# Patient Record
Sex: Female | Born: 1970 | Race: White | Hispanic: No | Marital: Married | Smoking: Never smoker
Health system: Southern US, Community
[De-identification: ages and names within clinical notes are randomized; demographics above are authoritative.]

## PROBLEM LIST (undated history)

## (undated) DIAGNOSIS — E669 Obesity, unspecified: Secondary | ICD-10-CM

## (undated) DIAGNOSIS — L570 Actinic keratosis: Secondary | ICD-10-CM

## (undated) DIAGNOSIS — Z1211 Encounter for screening for malignant neoplasm of colon: Secondary | ICD-10-CM

## (undated) DIAGNOSIS — L739 Follicular disorder, unspecified: Secondary | ICD-10-CM

## (undated) HISTORY — DX: Obesity, unspecified: E66.9

## (undated) HISTORY — DX: Encounter for screening for malignant neoplasm of colon: Z12.11

## (undated) HISTORY — DX: Actinic keratosis: L57.0

## (undated) HISTORY — DX: Follicular disorder, unspecified: L73.9

## (undated) HISTORY — PX: NO PAST SURGERIES: SHX2092

---

## 2004-10-04 ENCOUNTER — Ambulatory Visit: Payer: Self-pay

## 2005-09-29 ENCOUNTER — Inpatient Hospital Stay: Payer: Self-pay | Admitting: Unknown Physician Specialty

## 2009-02-03 ENCOUNTER — Ambulatory Visit: Payer: Self-pay

## 2009-02-22 DIAGNOSIS — Z86018 Personal history of other benign neoplasm: Secondary | ICD-10-CM

## 2009-02-22 HISTORY — DX: Personal history of other benign neoplasm: Z86.018

## 2011-04-17 ENCOUNTER — Ambulatory Visit: Payer: Self-pay

## 2017-12-10 DIAGNOSIS — L739 Follicular disorder, unspecified: Secondary | ICD-10-CM

## 2017-12-10 HISTORY — DX: Follicular disorder, unspecified: L73.9

## 2018-03-12 ENCOUNTER — Encounter: Payer: Self-pay | Admitting: Certified Nurse Midwife

## 2018-03-12 ENCOUNTER — Ambulatory Visit (INDEPENDENT_AMBULATORY_CARE_PROVIDER_SITE_OTHER): Payer: 59 | Admitting: Certified Nurse Midwife

## 2018-03-12 VITALS — BP 160/90 | HR 113 | Ht 67.0 in | Wt 224.0 lb

## 2018-03-12 DIAGNOSIS — Z1239 Encounter for other screening for malignant neoplasm of breast: Secondary | ICD-10-CM

## 2018-03-12 DIAGNOSIS — Z124 Encounter for screening for malignant neoplasm of cervix: Secondary | ICD-10-CM

## 2018-03-12 DIAGNOSIS — Z13220 Encounter for screening for lipoid disorders: Secondary | ICD-10-CM

## 2018-03-12 DIAGNOSIS — Z131 Encounter for screening for diabetes mellitus: Secondary | ICD-10-CM

## 2018-03-12 DIAGNOSIS — Z01419 Encounter for gynecological examination (general) (routine) without abnormal findings: Secondary | ICD-10-CM | POA: Diagnosis not present

## 2018-03-12 DIAGNOSIS — B373 Candidiasis of vulva and vagina: Secondary | ICD-10-CM | POA: Diagnosis not present

## 2018-03-12 DIAGNOSIS — B3731 Acute candidiasis of vulva and vagina: Secondary | ICD-10-CM

## 2018-03-12 DIAGNOSIS — E669 Obesity, unspecified: Secondary | ICD-10-CM | POA: Diagnosis not present

## 2018-03-12 DIAGNOSIS — N898 Other specified noninflammatory disorders of vagina: Secondary | ICD-10-CM

## 2018-03-12 DIAGNOSIS — Z1231 Encounter for screening mammogram for malignant neoplasm of breast: Secondary | ICD-10-CM | POA: Diagnosis not present

## 2018-03-12 DIAGNOSIS — Z1329 Encounter for screening for other suspected endocrine disorder: Secondary | ICD-10-CM

## 2018-03-12 DIAGNOSIS — R030 Elevated blood-pressure reading, without diagnosis of hypertension: Secondary | ICD-10-CM

## 2018-03-12 MED ORDER — FLUCONAZOLE 150 MG PO TABS: 150.0000 mg | ORAL_TABLET | Freq: Once | ORAL | 0 refills | Status: AC

## 2018-03-12 NOTE — Progress Notes (Signed)
Gynecology Annual Exam  PCP: Patient, No Pcp Per  Chief Complaint:  Chief Complaint  Patient presents with  . Gynecologic Exam    History of Present Illness: Deanna Rosales is a 47 year old MWF, G4 P3013, who presents  for her routine gyn exam. The patient has no significant gyn complaints, but has noticed a little vulvar itching recently.  Her menses are regular, they occur every month, and they last 28 days. Her flow is moderate with 2 heavier flow days requiring a pad change every 3 hours.. She does not have intermenstrual bleeding. Her last menstrual period was 02/24/2018. She denies lower abdominal cramping with her menses, but does have lower back pain. Last pap smear: 01/22/2014, results were NIL/neg   The patient is sexually active. She currently uses condoms for contraception. She does not have dyspareunia.  Since her last visit, she has gained 23#.   Her past medical history is remarkable for prehypertension or white coat syndrome  The patient does perform self breast exams. Her last mammogram was 01/22/2014, results were negative..   There is a family history of breast cancer in her mother. Genetic testing has not been done.   There is no family history of ovarian cancer.  The patient denies smoking.  She reports drinking alcohol. She reports have 3-6 drinks per week.   She denies illegal drug use.  The patient exercises by walking 30-60 minutes 4x/week..  The patient denies current symptoms of depression.    Review of Systems: Review of Systems  Constitutional: Negative for chills, fever and weight loss.  HENT: Negative for congestion, sinus pain and sore throat.   Eyes: Negative for blurred vision and pain.  Respiratory: Negative for hemoptysis, shortness of breath and wheezing.   Cardiovascular: Negative for chest pain, palpitations and leg swelling.  Gastrointestinal: Negative for abdominal pain, blood in stool, diarrhea, heartburn, nausea and  vomiting.  Genitourinary: Negative for dysuria, frequency, hematuria and urgency.       Positive for vaginal discharge, occasional vulvar itching  Musculoskeletal: Negative for back pain, joint pain and myalgias.  Skin: Negative for itching and rash.  Neurological: Negative for dizziness, tingling and headaches.  Endo/Heme/Allergies: Negative for environmental allergies and polydipsia. Does not bruise/bleed easily.       Negative for hirsutism   Psychiatric/Behavioral: Negative for depression. The patient is not nervous/anxious and does not have insomnia.     Past Medical History:  Past Medical History:  Diagnosis Date  . Folliculitis 6144   underarms  . Obesity (BMI 35.0-39.9 without comorbidity)    BMI=35    Past Surgical History:  Past Surgical History:  Procedure Laterality Date  . NO PAST SURGERIES      Family History:  Family History  Problem Relation Age of Onset  . Breast cancer Mother 62  . Hypertension Mother   . Hypertension Father   . Melanoma Maternal Aunt   . Hypertension Brother   . Hypertension Brother     Social History:  Social History   Socioeconomic History  . Marital status: Married    Spouse name: Not on file  . Number of children: 3  . Years of education: Not on file  . Highest education level: Not on file  Occupational History  . Occupation: Agricultural engineer  Social Needs  . Financial resource strain: Not on file  . Food insecurity:    Worry: Not on file    Inability: Not on file  . Transportation  needs:    Medical: Not on file    Non-medical: Not on file  Tobacco Use  . Smoking status: Never Smoker  . Smokeless tobacco: Never Used  Substance and Sexual Activity  . Alcohol use: Yes    Alcohol/week: 1.8 - 3.6 oz    Types: 3 - 6 Glasses of wine per week  . Drug use: Not Currently  . Sexual activity: Yes    Partners: Male    Birth control/protection: Condom  Lifestyle  . Physical activity:    Days per week: 5 days    Minutes per  session: 50 min  . Stress: Not at all  Relationships  . Social connections:    Talks on phone: Not on file    Gets together: Not on file    Attends religious service: Not on file    Active member of club or organization: Not on file    Attends meetings of clubs or organizations: Not on file    Relationship status: Not on file  . Intimate partner violence:    Fear of current or ex partner: Not on file    Emotionally abused: Not on file    Physically abused: Not on file    Forced sexual activity: Not on file  Other Topics Concern  . Not on file  Social History Narrative  . Not on file    Allergies:  No Known Allergies  Medications: Current Outpatient Medications on File Prior to Visit  Medication Sig Dispense Refill  . Calcium Carbonate-Vit D-Min (CALTRATE 600+D PLUS MINERALS) 600-800 MG-UNIT TABS Take 1 tablet by mouth daily.    . Cholecalciferol (VITAMIN D3) 1000 units CAPS Take 1 capsule by mouth daily.    Marland Kitchen co-enzyme Q-10 30 MG capsule Take 30 mg by mouth daily.    Jerrye Beavers Polysacch (ALCORTIN A) 1-2-1 % GEL APPLY A THIN LAYER TO AFFECTED AREA(S) AT BEDTIME  2  . Multiple Vitamin (MULTIVITAMIN) tablet Take 1 tablet by mouth daily.    . Probiotic Product (PROBIOTIC-10 PO) Take 1 capsule by mouth daily.     No current facility-administered medications on file prior to visit.   Physical Exam Vitals: BP (!) 160/90   Pulse (!) 113   Ht 5\' 7"  (1.702 m)   Wt 224 lb (101.6 kg)   LMP 02/24/2018 (Exact Date)   BMI 35.08 kg/m   General: WF in NAD HEENT: normocephalic, anicteric Neck: no thyroid enlargement, no palpable nodules, no cervical lymphadenopathy  Pulmonary: No increased work of breathing, CTAB Cardiovascular: RRR, without murmur  Breast: Breast symmetrical, no tenderness, no palpable nodules or masses, no skin or nipple retraction present, no nipple discharge.  No axillary, infraclavicular or supraclavicular lymphadenopathy. Abdomen: Soft, non-tender,  non-distended.  Umbilicus without lesions.  No hepatomegaly or masses palpable. No evidence of hernia. Genitourinary:  External: Normal external female genitalia.  Normal urethral meatus, normal  Bartholin's and Skene's glands.    Vagina: Normal vaginal mucosa, poor tone, cottage cheese discharge   Cervix: Grossly normal in appearance, anterior, no bleeding, non-tender  Uterus: Retroverted, normal size, shape, and consistency, mobile, and non-tender  Adnexa: No adnexal masses, non-tender  Rectal: deferred  Lymphatic: no evidence of inguinal lymphadenopathy Extremities: no edema, erythema, or tenderness Neurologic: Grossly intact Psychiatric: mood appropriate, affect full  Results for orders placed or performed in visit on 03/12/18 (from the past 72 hour(s))  POCT Wet Prep [facility])     Status: Abnormal   Collection Time: 03/17/18  8:41 PM  Result Value Ref Range   Source Wet Prep POC vagina    WBC, Wet Prep HPF POC     Bacteria Wet Prep HPF POC  Few   BACTERIA WET PREP MORPHOLOGY POC     Clue Cells Wet Prep HPF POC None None   Clue Cells Wet Prep Whiff POC     Yeast Wet Prep HPF POC Many    KOH Wet Prep POC     Trichomonas Wet Prep HPF POC Absent Absent     Assessment: 47 y.o. routine gyn exam Elevated blood pressure Monilial vulvovaginitis  Plan:    1) Breast cancer screening - recommend monthly self breast exam and annual screening mammograms Mammogram was ordered today.  2) States her blood pressure is always elevated when she goes for a medical appointment, but normal at home. Advised to take blood pressures daily at home or at work and keep a log. To check accuracy of her home blood pressure cuff by comparing readings at her work. To let me know if BP >1/=140/90  3) Cervical cancer screening - Pap was done. ASCCP guidelines and rational discussed.  Patient opts for every 3 years screening interva  4) Routine healthcare maintenance including cholesterol and diabetes  screening ordered today   5) Diflucan 150 mgm x 1 po.   6) RTO 1 year and prn.  Dalia Heading, CNM

## 2018-03-13 ENCOUNTER — Encounter (INDEPENDENT_AMBULATORY_CARE_PROVIDER_SITE_OTHER): Payer: Self-pay

## 2018-03-13 LAB — LIPID PANEL
CHOLESTEROL TOTAL: 197 mg/dL (ref 100–199)
Chol/HDL Ratio: 2.3 ratio (ref 0.0–4.4)
HDL: 87 mg/dL (ref >39)
LDL CALC: 93 mg/dL (ref 0–99)
Triglycerides: 83 mg/dL (ref 0–149)
VLDL CHOLESTEROL CAL: 17 mg/dL (ref 5–40)

## 2018-03-13 LAB — COMPREHENSIVE METABOLIC PANEL
A/G RATIO: 1.8 (ref 1.2–2.2)
ALK PHOS: 61 IU/L (ref 39–117)
ALT: 23 IU/L (ref 0–32)
AST: 20 IU/L (ref 0–40)
Albumin: 4.4 g/dL (ref 3.5–5.5)
BILIRUBIN TOTAL: 0.6 mg/dL (ref 0.0–1.2)
BUN/Creatinine Ratio: 13 (ref 9–23)
BUN: 10 mg/dL (ref 6–24)
CALCIUM: 9.8 mg/dL (ref 8.7–10.2)
CHLORIDE: 102 mmol/L (ref 96–106)
CO2: 23 mmol/L (ref 20–29)
Creatinine, Ser: 0.77 mg/dL (ref 0.57–1.00)
GFR calc Af Amer: 106 mL/min/{1.73_m2} (ref >59)
GFR, EST NON AFRICAN AMERICAN: 92 mL/min/{1.73_m2} (ref >59)
GLOBULIN, TOTAL: 2.4 g/dL (ref 1.5–4.5)
Glucose: 83 mg/dL (ref 65–99)
POTASSIUM: 3.7 mmol/L (ref 3.5–5.2)
SODIUM: 142 mmol/L (ref 134–144)
Total Protein: 6.8 g/dL (ref 6.0–8.5)

## 2018-03-13 LAB — CBC WITH DIFFERENTIAL/PLATELET
BASOS: 0 %
Basophils Absolute: 0 10*3/uL (ref 0.0–0.2)
EOS (ABSOLUTE): 0.1 10*3/uL (ref 0.0–0.4)
Eos: 2 %
HEMATOCRIT: 43 % (ref 34.0–46.6)
Hemoglobin: 14.6 g/dL (ref 11.1–15.9)
IMMATURE GRANULOCYTES: 0 %
Immature Grans (Abs): 0 10*3/uL (ref 0.0–0.1)
Lymphocytes Absolute: 1.8 10*3/uL (ref 0.7–3.1)
Lymphs: 23 %
MCH: 30 pg (ref 26.6–33.0)
MCHC: 34 g/dL (ref 31.5–35.7)
MCV: 88 fL (ref 79–97)
MONOS ABS: 0.4 10*3/uL (ref 0.1–0.9)
Monocytes: 5 %
NEUTROS PCT: 70 %
Neutrophils Absolute: 5.6 10*3/uL (ref 1.4–7.0)
Platelets: 273 10*3/uL (ref 150–379)
RBC: 4.87 x10E6/uL (ref 3.77–5.28)
RDW: 14 % (ref 12.3–15.4)
WBC: 7.9 10*3/uL (ref 3.4–10.8)

## 2018-03-13 LAB — HEMOGLOBIN A1C
ESTIMATED AVERAGE GLUCOSE: 94 mg/dL
HEMOGLOBIN A1C: 4.9 % (ref 4.8–5.6)

## 2018-03-13 LAB — TSH: TSH: 0.758 u[IU]/mL (ref 0.450–4.500)

## 2018-03-15 LAB — IGP, APTIMA HPV
HPV APTIMA: NEGATIVE
PAP SMEAR COMMENT: 0

## 2018-03-17 ENCOUNTER — Encounter: Payer: Self-pay | Admitting: Certified Nurse Midwife

## 2018-03-17 LAB — POCT WET PREP (WET MOUNT): TRICHOMONAS WET PREP HPF POC: ABSENT

## 2018-04-03 ENCOUNTER — Ambulatory Visit
Admission: RE | Admit: 2018-04-03 | Discharge: 2018-04-03 | Disposition: A | Discharge status: Home / Self Care | Payer: 59 | Source: Ambulatory Visit | Attending: Certified Nurse Midwife | Admitting: Certified Nurse Midwife

## 2018-04-03 DIAGNOSIS — Z1231 Encounter for screening mammogram for malignant neoplasm of breast: Secondary | ICD-10-CM | POA: Diagnosis not present

## 2018-04-03 DIAGNOSIS — Z1239 Encounter for other screening for malignant neoplasm of breast: Secondary | ICD-10-CM

## 2018-04-05 ENCOUNTER — Encounter (INDEPENDENT_AMBULATORY_CARE_PROVIDER_SITE_OTHER): Payer: Self-pay

## 2019-09-23 DIAGNOSIS — C4491 Basal cell carcinoma of skin, unspecified: Secondary | ICD-10-CM

## 2019-09-23 HISTORY — DX: Basal cell carcinoma of skin, unspecified: C44.91

## 2020-03-16 ENCOUNTER — Ambulatory Visit: Payer: 59 | Admitting: Dermatology

## 2020-05-02 ENCOUNTER — Other Ambulatory Visit: Payer: Self-pay

## 2020-05-02 ENCOUNTER — Ambulatory Visit: Payer: 59 | Admitting: Dermatology

## 2020-05-02 DIAGNOSIS — L720 Epidermal cyst: Secondary | ICD-10-CM

## 2020-05-02 DIAGNOSIS — L821 Other seborrheic keratosis: Secondary | ICD-10-CM

## 2020-05-02 DIAGNOSIS — L570 Actinic keratosis: Secondary | ICD-10-CM

## 2020-05-02 DIAGNOSIS — Z86018 Personal history of other benign neoplasm: Secondary | ICD-10-CM

## 2020-05-02 DIAGNOSIS — Z85828 Personal history of other malignant neoplasm of skin: Secondary | ICD-10-CM | POA: Diagnosis not present

## 2020-05-02 DIAGNOSIS — L814 Other melanin hyperpigmentation: Secondary | ICD-10-CM

## 2020-05-02 DIAGNOSIS — L578 Other skin changes due to chronic exposure to nonionizing radiation: Secondary | ICD-10-CM | POA: Diagnosis not present

## 2020-05-02 DIAGNOSIS — D229 Melanocytic nevi, unspecified: Secondary | ICD-10-CM

## 2020-05-02 NOTE — Patient Instructions (Addendum)
Recommend daily broad spectrum sunscreen SPF 30+ to sun-exposed areas, reapply every 2 hours as needed. Call for new or changing lesions.  Cryotherapy Aftercare  . Wash gently with soap and water everyday.   . Apply Vaseline and Band-Aid daily until healed.  

## 2020-05-02 NOTE — Progress Notes (Signed)
   Follow-Up Visit   Subjective  Deanna Rosales is a 49 y.o. female who presents for the following: Follow-up.  Patient here today to have sun exposed areas checked. She does have a history of dysplastic nevi and BCC. She does have one rough spot on right thigh that has been present for about 2 months. There is also a spot on left upper back/shoulder that patient noticed recently but non-symptomatic.   The following portions of the chart were reviewed this encounter and updated as appropriate:  Tobacco  Allergies  Meds  Problems  Med Hx  Surg Hx  Fam Hx      Review of Systems:  No other skin or systemic complaints except as noted in HPI or Assessment and Plan.  Objective  Well appearing patient in no apparent distress; mood and affect are within normal limits.  A focused examination was performed including extremities, back, face. Relevant physical exam findings are noted in the Assessment and Plan.  Objective  Mid Forehead: Erythematous thin papules/macules with gritty scale.   Objective  Left mid dorsum nose, left infranasal at nasal rim: Smooth white papule(s).    Assessment & Plan  AK (actinic keratosis) Mid Forehead  Destruction of lesion - Mid Forehead Complexity: simple   Destruction method: cryotherapy   Informed consent: discussed and consent obtained   Timeout:  patient name, date of birth, surgical site, and procedure verified Lesion destroyed using liquid nitrogen: Yes   Region frozen until ice ball extended beyond lesion: Yes   Outcome: patient tolerated procedure well with no complications   Post-procedure details: wound care instructions given    Milia Left mid dorsum nose, left infranasal at nasal rim  Benign-appearing.  Observation.  Call clinic for new or changing moles.  Recommend daily use of broad spectrum spf 30+ sunscreen to sun-exposed areas.     History of Basal Cell Carcinoma of the Skin - No evidence of recurrence today - Recommend  regular full body skin exams - Recommend daily broad spectrum sunscreen SPF 30+ to sun-exposed areas, reapply every 2 hours as needed.  - Call if any new or changing lesions are noted between office visits  History of Dysplastic Nevi - No evidence of recurrence today - Recommend regular full body skin exams - Recommend daily broad spectrum sunscreen SPF 30+ to sun-exposed areas, reapply every 2 hours as needed.  - Call if any new or changing lesions are noted between office visits  Actinic Damage - diffuse scaly erythematous macules with underlying dyspigmentation - Recommend daily broad spectrum sunscreen SPF 30+ to sun-exposed areas, reapply every 2 hours as needed.  - Call for new or changing lesions.  Seborrheic Keratoses - Stuck-on, waxy, tan-brown papules and plaques  - Discussed benign etiology and prognosis. - Observe - Call for any changes  Melanocytic Nevi - Tan-brown and/or pink-flesh-colored symmetric macules and papules - Benign appearing on exam today - Observation - Call clinic for new or changing moles - Recommend daily use of broad spectrum spf 30+ sunscreen to sun-exposed areas.   Lentigines - Scattered tan macules - Discussed due to sun exposure - Benign, observe - Call for any changes  Return in about 6 months (around 11/02/2020) for TBSE.  Graciella Belton, RMA, am acting as scribe for Sarina Ser, MD . Documentation: I have reviewed the above documentation for accuracy and completeness, and I agree with the above.  Sarina Ser, MD

## 2020-05-03 ENCOUNTER — Encounter: Payer: Self-pay | Admitting: Dermatology

## 2020-07-27 NOTE — Progress Notes (Signed)
Gynecology Annual Exam  PCP: Dalia Heading, CNM  Chief Complaint:  Chief Complaint  Patient presents with  . Gynecologic Exam    menopause    History of Present Illness: [name] is a 49 year old MWF, G4 P3013, who presents  for her routine gyn exam. The patient has noticed some changes to her menses. They are every 31-32 days apart (were every 28 days), usually lasting 4-6 days (her June menses lasted 10 days), with 2-3 heavy days requiring a pad change every 2-3 hours.    She does not have intermenstrual bleeding. Her last menstrual period was 07/04/2020. She has cramping 2 days prior to her menses, but does not take any analgesics. Denies hot flashes. Last pap smear: 03/12/2018, results were NIL/neg   The patient is sexually active. She currently uses condoms and withdrawl for contraception. She does not have dyspareunia.  Since her last visit, she has had no significant changes in her health.  Her past medical history is remarkable for prehypertension or white coat syndrome. She reports her blood pressures at home are in the 130s/80s.   The patient does perform self breast exams. Her last mammogram was 04/04/2018 results were negative..   There is a family history of breast cancer in her mother. Genetic testing has not been done.   There is no family history of ovarian cancer.  The patient denies smoking.  She reports drinking alcohol. She reports have 3 drinks per week.   She denies illegal drug use.  The patient exercises by walking 20-30 minutes 4-5x/week..  The patient denies current symptoms of depression.    Review of Systems: Review of Systems  Constitutional: Negative for chills, fever and weight loss.  HENT: Negative for congestion, sinus pain and sore throat.   Eyes: Positive for redness. Negative for blurred vision and pain.  Respiratory: Negative for hemoptysis, shortness of breath and wheezing.   Cardiovascular: Negative for chest pain,  palpitations and leg swelling.  Gastrointestinal: Negative for abdominal pain, blood in stool, diarrhea, heartburn, nausea and vomiting.  Genitourinary: Negative for dysuria, frequency, hematuria and urgency.  Musculoskeletal: Negative for back pain, joint pain and myalgias.  Skin: Negative for itching and rash.  Neurological: Negative for dizziness, tingling and headaches.  Endo/Heme/Allergies: Negative for environmental allergies and polydipsia. Does not bruise/bleed easily.       Negative for hirsutism   Psychiatric/Behavioral: Negative for depression. The patient is not nervous/anxious and does not have insomnia.   Breasts: positive for breast tenderness  Past Medical History:  Past Medical History:  Diagnosis Date  . Actinic keratosis   . Basal cell carcinoma 09/23/2019   Left nasal ala. Nodular pattern. Tx: [facility] 10/15/2019  . Folliculitis 0454   underarms  . Hx of dysplastic nevus 02/22/2009   Right med. distal antecubital. Slight atypia  . Hx of dysplastic nevus 01/19/2014   Left mid anterior thigh. Recurrent dysplastic nevus, margins free  . Hx of dysplastic nevus 12/29/2014   Right inferior buttock. Moderate atypia, limited margins free  . Obesity (BMI 35.0-39.9 without comorbidity)    BMI=35    Past Surgical History:  Past Surgical History:  Procedure Laterality Date  . NO PAST SURGERIES      Family History:  Family History  Problem Relation Age of Onset  . Breast cancer Mother 41  . Hypertension Mother   . Hypertension Father   . Melanoma Maternal Aunt   . Hypertension Brother   . Hypertension  Brother     Social History:  Social History   Socioeconomic History  . Marital status: Married    Spouse name: Not on file  . Number of children: 3  . Years of education: Not on file  . Highest education level: Not on file  Occupational History  . Occupation: Homemaker  Tobacco Use  . Smoking status: Never Smoker  . Smokeless tobacco: Never Used  Vaping Use    . Vaping Use: Never used  Substance and Sexual Activity  . Alcohol use: Yes    Alcohol/week: 3.0 standard drinks    Types: 3 Shots of liquor per week    Comment: vodka  . Drug use: Not Currently  . Sexual activity: Yes    Partners: Male    Birth control/protection: Condom  Other Topics Concern  . Not on file  Social History Narrative  . Not on file   Social Determinants of Health   Financial Resource Strain:   . Difficulty of Paying Living Expenses: Not on file  Food Insecurity:   . Worried About Charity fundraiser in the Last Year: Not on file  . Ran Out of Food in the Last Year: Not on file  Transportation Needs:   . Lack of Transportation (Medical): Not on file  . Lack of Transportation (Non-Medical): Not on file  Physical Activity:   . Days of Exercise per Week: Not on file  . Minutes of Exercise per Session: Not on file  Stress:   . Feeling of Stress : Not on file  Social Connections:   . Frequency of Communication with Friends and Family: Not on file  . Frequency of Social Gatherings with Friends and Family: Not on file  . Attends Religious Services: Not on file  . Active Member of Clubs or Organizations: Not on file  . Attends Archivist Meetings: Not on file  . Marital Status: Not on file  Intimate Partner Violence:   . Fear of Current or Ex-Partner: Not on file  . Emotionally Abused: Not on file  . Physically Abused: Not on file  . Sexually Abused: Not on file    Allergies:  No Known Allergies  Medications: Current Outpatient Medications:  .  Calcium Carbonate-Vit D-Min (CALTRATE 600+D PLUS MINERALS) 600-800 MG-UNIT TABS, Take 1 tablet by mouth daily., Disp: , Rfl:  .  Cholecalciferol (VITAMIN D3) 1000 units CAPS, Take 1 capsule by mouth daily. , Disp: , Rfl:  .  magnesium hydroxide (PHILLIPS CHEWS) 311 MG CHEW chewable tablet, Chew 311 mg by mouth daily., Disp: , Rfl:  .  Multiple Vitamin (MULTIVITAMIN) tablet, Take 1 tablet by mouth daily.,  Disp: , Rfl:  .  Probiotic Product (PROBIOTIC-10 PO), Take 1 capsule by mouth daily., Disp: , Rfl:   Physical Exam Vitals: BP (!) 184/98   Pulse 96   Ht 5\' 7"  (1.702 m)   Wt 226 lb (102.5 kg)   LMP 07/04/2020 (Exact Date)   BMI 35.40 kg/m   General: WF in NAD HEENT: normocephalic, anicteric Neck: no thyroid enlargement, no palpable nodules, no cervical lymphadenopathy  Pulmonary: No increased work of breathing, CTAB Cardiovascular: RRR, without murmur  Breast: Breast symmetrical, no tenderness, no palpable nodules or masses, no skin or nipple retraction present, no nipple discharge.  No axillary, infraclavicular or supraclavicular lymphadenopathy. Abdomen: Soft, non-tender, non-distended.  Umbilicus without lesions.  No hepatomegaly or masses palpable. No evidence of hernia. Genitourinary:  External: Normal external female genitalia.  Normal urethral meatus, normal  Bartholin's and Skene's glands.    Vagina: Normal vaginal mucosa, no lesions  Cervix: Grossly normal in appearance, anterior, no bleeding, non-tender  Uterus: Retroverted, normal size, shape, and consistency, mobile, and non-tender  Adnexa: No adnexal masses, non-tender  Rectal: deferred  Lymphatic: no evidence of inguinal lymphadenopathy Extremities: no edema, erythema, or tenderness Neurologic: Grossly intact Psychiatric: mood appropriate, affect full    Assessment: 49 y.o. routine gyn exam Elevated blood pressure. Hx of white coat syndrome.  Plan  1) Breast cancer screening - recommend monthly self breast exam and annual screening mammograms Mammogram was ordered today.  2) States her blood pressure is always elevated when she goes for a medical appointment, but normal at home. Advised to take blood pressures daily at home or at work and keep a log.  If BP >1/=140/90, to go to urgent care and or start care with a primary care provider to treat hypertension.  3) Cervical cancer screening - Pap was done. ASCCP  guidelines and rational discussed.  Patient opts for every 1-2 year Pap smears  4) Routine healthcare maintenance including cholesterol and diabetes screening ordered today CMP also ordered  5) Colon cancer screening: Discussed options for colon cancer screening. Patient would like to do the Cologuard.   6)Discussed calcium and vitamin D3 requirements and the role of exercise in preventing osteoporosis. Explained menstrual irregularities in the perimenopausal time frame are normal.  6) RTO 1 year and prn.  Dalia Heading, CNM

## 2020-07-28 ENCOUNTER — Other Ambulatory Visit (HOSPITAL_COMMUNITY)
Admission: RE | Admit: 2020-07-28 | Discharge: 2020-07-28 | Disposition: A | Discharge status: Home / Self Care | Payer: 59 | Source: Ambulatory Visit | Attending: Certified Nurse Midwife | Admitting: Certified Nurse Midwife

## 2020-07-28 ENCOUNTER — Encounter: Payer: Self-pay | Admitting: Certified Nurse Midwife

## 2020-07-28 ENCOUNTER — Ambulatory Visit (INDEPENDENT_AMBULATORY_CARE_PROVIDER_SITE_OTHER): Payer: 59 | Admitting: Certified Nurse Midwife

## 2020-07-28 ENCOUNTER — Other Ambulatory Visit: Payer: Self-pay

## 2020-07-28 VITALS — BP 184/98 | HR 96 | Ht 67.0 in | Wt 226.0 lb

## 2020-07-28 DIAGNOSIS — Z131 Encounter for screening for diabetes mellitus: Secondary | ICD-10-CM | POA: Diagnosis not present

## 2020-07-28 DIAGNOSIS — Z1211 Encounter for screening for malignant neoplasm of colon: Secondary | ICD-10-CM

## 2020-07-28 DIAGNOSIS — Z01419 Encounter for gynecological examination (general) (routine) without abnormal findings: Secondary | ICD-10-CM | POA: Diagnosis present

## 2020-07-28 DIAGNOSIS — Z1231 Encounter for screening mammogram for malignant neoplasm of breast: Secondary | ICD-10-CM

## 2020-07-28 DIAGNOSIS — Z124 Encounter for screening for malignant neoplasm of cervix: Secondary | ICD-10-CM | POA: Diagnosis present

## 2020-07-28 DIAGNOSIS — R030 Elevated blood-pressure reading, without diagnosis of hypertension: Secondary | ICD-10-CM

## 2020-07-28 DIAGNOSIS — Z13220 Encounter for screening for lipoid disorders: Secondary | ICD-10-CM

## 2020-07-29 LAB — CMP14+EGFR
ALT: 21 IU/L (ref 0–32)
AST: 23 IU/L (ref 0–40)
Albumin/Globulin Ratio: 2.1 (ref 1.2–2.2)
Albumin: 4.7 g/dL (ref 3.8–4.9)
Alkaline Phosphatase: 78 IU/L (ref 48–121)
BUN/Creatinine Ratio: 14 (ref 9–23)
BUN: 10 mg/dL (ref 6–24)
Bilirubin Total: 0.7 mg/dL (ref 0.0–1.2)
CO2: 25 mmol/L (ref 20–29)
Calcium: 9.2 mg/dL (ref 8.7–10.2)
Chloride: 100 mmol/L (ref 96–106)
Creatinine, Ser: 0.72 mg/dL (ref 0.57–1.00)
GFR calc Af Amer: 112 mL/min/{1.73_m2} (ref >59)
GFR calc non Af Amer: 97 mL/min/{1.73_m2} (ref >59)
Globulin, Total: 2.2 g/dL (ref 1.5–4.5)
Glucose: 91 mg/dL (ref 65–99)
Potassium: 4.3 mmol/L (ref 3.5–5.2)
Sodium: 139 mmol/L (ref 134–144)
Total Protein: 6.9 g/dL (ref 6.0–8.5)

## 2020-07-29 LAB — LIPID PANEL WITH LDL/HDL RATIO
Cholesterol, Total: 232 mg/dL — ABNORMAL HIGH (ref 100–199)
HDL: 94 mg/dL (ref >39)
LDL Chol Calc (NIH): 120 mg/dL — ABNORMAL HIGH (ref 0–99)
LDL/HDL Ratio: 1.3 ratio (ref 0.0–3.2)
Triglycerides: 104 mg/dL (ref 0–149)
VLDL Cholesterol Cal: 18 mg/dL (ref 5–40)

## 2020-07-29 LAB — HEMOGLOBIN A1C
Est. average glucose Bld gHb Est-mCnc: 94 mg/dL
Hgb A1c MFr Bld: 4.9 % (ref 4.8–5.6)

## 2020-07-30 ENCOUNTER — Encounter: Payer: Self-pay | Admitting: Certified Nurse Midwife

## 2020-08-01 LAB — CYTOLOGY - PAP: Diagnosis: NEGATIVE

## 2020-08-16 LAB — COLOGUARD: Cologuard: NEGATIVE

## 2020-08-22 ENCOUNTER — Ambulatory Visit
Admission: RE | Admit: 2020-08-22 | Discharge: 2020-08-22 | Disposition: A | Discharge status: Home / Self Care | Payer: 59 | Source: Ambulatory Visit | Attending: Certified Nurse Midwife | Admitting: Certified Nurse Midwife

## 2020-08-22 ENCOUNTER — Other Ambulatory Visit: Payer: Self-pay

## 2020-08-22 DIAGNOSIS — Z1231 Encounter for screening mammogram for malignant neoplasm of breast: Secondary | ICD-10-CM | POA: Insufficient documentation

## 2020-08-22 DIAGNOSIS — Z01419 Encounter for gynecological examination (general) (routine) without abnormal findings: Secondary | ICD-10-CM | POA: Diagnosis not present

## 2020-08-24 ENCOUNTER — Other Ambulatory Visit: Payer: Self-pay | Admitting: Obstetrics & Gynecology

## 2020-08-24 ENCOUNTER — Encounter: Payer: Self-pay | Admitting: Obstetrics and Gynecology

## 2020-08-24 DIAGNOSIS — R928 Other abnormal and inconclusive findings on diagnostic imaging of breast: Secondary | ICD-10-CM

## 2020-08-24 DIAGNOSIS — N6489 Other specified disorders of breast: Secondary | ICD-10-CM

## 2020-09-01 ENCOUNTER — Ambulatory Visit
Admission: RE | Admit: 2020-09-01 | Discharge: 2020-09-01 | Disposition: A | Discharge status: Home / Self Care | Payer: 59 | Source: Ambulatory Visit | Attending: Obstetrics & Gynecology | Admitting: Obstetrics & Gynecology

## 2020-09-01 ENCOUNTER — Other Ambulatory Visit: Payer: Self-pay

## 2020-09-01 DIAGNOSIS — R928 Other abnormal and inconclusive findings on diagnostic imaging of breast: Secondary | ICD-10-CM

## 2020-09-01 DIAGNOSIS — N6489 Other specified disorders of breast: Secondary | ICD-10-CM

## 2020-09-02 ENCOUNTER — Other Ambulatory Visit: Payer: Self-pay | Admitting: Obstetrics & Gynecology

## 2020-09-02 DIAGNOSIS — R928 Other abnormal and inconclusive findings on diagnostic imaging of breast: Secondary | ICD-10-CM

## 2020-09-02 DIAGNOSIS — N6489 Other specified disorders of breast: Secondary | ICD-10-CM

## 2020-09-13 ENCOUNTER — Other Ambulatory Visit: Payer: Self-pay | Admitting: Obstetrics & Gynecology

## 2020-09-13 ENCOUNTER — Ambulatory Visit
Admission: RE | Admit: 2020-09-13 | Discharge: 2020-09-13 | Disposition: A | Discharge status: Home / Self Care | Payer: 59 | Source: Ambulatory Visit | Attending: Obstetrics & Gynecology | Admitting: Obstetrics & Gynecology

## 2020-09-13 ENCOUNTER — Other Ambulatory Visit: Payer: Self-pay

## 2020-09-13 DIAGNOSIS — N6489 Other specified disorders of breast: Secondary | ICD-10-CM | POA: Diagnosis present

## 2020-09-13 DIAGNOSIS — R928 Other abnormal and inconclusive findings on diagnostic imaging of breast: Secondary | ICD-10-CM

## 2020-09-13 HISTORY — PX: BREAST BIOPSY: SHX20

## 2020-10-24 ENCOUNTER — Other Ambulatory Visit: Payer: Self-pay

## 2020-10-24 ENCOUNTER — Ambulatory Visit: Payer: 59 | Admitting: Dermatology

## 2020-10-24 ENCOUNTER — Encounter: Payer: Self-pay | Admitting: Dermatology

## 2020-10-24 DIAGNOSIS — Z86018 Personal history of other benign neoplasm: Secondary | ICD-10-CM

## 2020-10-24 DIAGNOSIS — D1800 Hemangioma unspecified site: Secondary | ICD-10-CM

## 2020-10-24 DIAGNOSIS — L820 Inflamed seborrheic keratosis: Secondary | ICD-10-CM | POA: Diagnosis not present

## 2020-10-24 DIAGNOSIS — L738 Other specified follicular disorders: Secondary | ICD-10-CM | POA: Diagnosis not present

## 2020-10-24 DIAGNOSIS — L814 Other melanin hyperpigmentation: Secondary | ICD-10-CM

## 2020-10-24 DIAGNOSIS — L570 Actinic keratosis: Secondary | ICD-10-CM

## 2020-10-24 DIAGNOSIS — L720 Epidermal cyst: Secondary | ICD-10-CM

## 2020-10-24 DIAGNOSIS — Z85828 Personal history of other malignant neoplasm of skin: Secondary | ICD-10-CM | POA: Diagnosis not present

## 2020-10-24 DIAGNOSIS — D229 Melanocytic nevi, unspecified: Secondary | ICD-10-CM

## 2020-10-24 DIAGNOSIS — Z1283 Encounter for screening for malignant neoplasm of skin: Secondary | ICD-10-CM

## 2020-10-24 DIAGNOSIS — L578 Other skin changes due to chronic exposure to nonionizing radiation: Secondary | ICD-10-CM

## 2020-10-24 DIAGNOSIS — L821 Other seborrheic keratosis: Secondary | ICD-10-CM

## 2020-10-24 NOTE — Patient Instructions (Signed)
Cryotherapy Aftercare  . Wash gently with soap and water everyday.   . Apply Vaseline and Band-Aid daily until healed.  

## 2020-10-24 NOTE — Progress Notes (Deleted)
Follow-Up Visit   Subjective  Deanna Rosales is a 49 y.o. female who presents for the following: Annual Exam (6 months f/u Hx of BCC, Hx of Dysplastic nevus ). Pt c/o irritated spots on the face and back growing and itching.     The following portions of the chart were reviewed this encounter and updated as appropriate:      Review of Systems:  No other skin or systemic complaints except as noted in HPI or Assessment and Plan.  Objective  Well appearing patient in no apparent distress; mood and affect are within normal limits.  A full examination was performed including scalp, head, eyes, ears, nose, lips, neck, chest, axillae, abdomen, back, buttocks, bilateral upper extremities, bilateral lower extremities, hands, feet, fingers, toes, fingernails, and toenails. All findings within normal limits unless otherwise noted below.  Objective  L nasal ala: Well healed scar with no evidence of recurrence.   Objective  Left Abdomen (side) - Upper: Scar with no evidence of recurrence.   Objective  Head - Anterior (Face) (4): Erythematous thin papules/macules with gritty scale.   Objective  face, R upper back (2): Erythematous keratotic or waxy stuck-on papule or plaque.   Objective  L nasal bridge: Yellowish papule   Objective  L infranasal: Small pink papule    Assessment & Plan  History of basal cell carcinoma (BCC) L nasal ala  Clear. Observe for recurrence. Call clinic for new or changing lesions.  Recommend regular skin exams, daily broad-spectrum spf 30+ sunscreen use, and photoprotection.     History of dysplastic nevus Left Abdomen (side) - Upper  Clear. Observe for recurrence. Call clinic for new or changing lesions.  Recommend regular skin exams, daily broad-spectrum spf 30+ sunscreen use, and photoprotection.     AK (actinic keratosis) (4) Head - Anterior (Face)  Destruction of lesion - Head - Anterior (Face) Complexity: simple   Destruction method:  cryotherapy   Informed consent: discussed and consent obtained   Timeout:  patient name, date of birth, surgical site, and procedure verified Lesion destroyed using liquid nitrogen: Yes   Region frozen until ice ball extended beyond lesion: Yes   Outcome: patient tolerated procedure well with no complications   Post-procedure details: wound care instructions given    Inflamed seborrheic keratosis (2) face, R upper back  Destruction of lesion - face, R upper back Complexity: simple   Destruction method: cryotherapy   Informed consent: discussed and consent obtained   Timeout:  patient name, date of birth, surgical site, and procedure verified Lesion destroyed using liquid nitrogen: Yes   Region frozen until ice ball extended beyond lesion: Yes   Outcome: patient tolerated procedure well with no complications   Post-procedure details: wound care instructions given    Sebaceous hyperplasia of face L nasal bridge  Benign-appearing.  Observation.  Call clinic for new or changing moles.  Recommend daily use of broad spectrum spf 30+ sunscreen to sun-exposed areas.    Milia L infranasal  Observe    Lentigines - Scattered tan macules - Discussed due to sun exposure - Benign, observe - Call for any changes  Seborrheic Keratoses - Stuck-on, waxy, tan-brown papules and plaques  - Discussed benign etiology and prognosis. - Observe - Call for any changes  Melanocytic Nevi - Tan-brown and/or pink-flesh-colored symmetric macules and papules - Benign appearing on exam today - Observation - Call clinic for new or changing moles - Recommend daily use of broad spectrum spf 30+ sunscreen to  sun-exposed areas.   Hemangiomas - Red papules - Discussed benign nature - Observe - Call for any changes  Actinic Damage - Chronic, secondary to cumulative UV/sun exposure - diffuse scaly erythematous macules with underlying dyspigmentation - Recommend daily broad spectrum sunscreen SPF 30+  to sun-exposed areas, reapply every 2 hours as needed.  - Call for new or changing lesions.  Skin cancer screening performed today.  Return in about 6 months (around 04/23/2021) for TBSE .  IMarye Round, CMA, am acting as scribe for Sarina Ser, MD .

## 2020-11-01 ENCOUNTER — Encounter: Payer: Self-pay | Admitting: Dermatology

## 2020-11-01 NOTE — Progress Notes (Signed)
Follow-Up Visit   Subjective  Deanna Rosales is a 49 y.o. female who presents for the following: Annual Exam (6 months f/u Hx of BCC, Hx of Dysplastic nevus ).  Pt c/o irritated spots on the face and back growing and itching.  The patient presents for Total-Body Skin Exam (TBSE) for skin cancer screening and mole check.  The following portions of the chart were reviewed this encounter and updated as appropriate:  Tobacco  Allergies  Meds  Problems  Med Hx  Surg Hx  Fam Hx     Review of Systems:  No other skin or systemic complaints except as noted in HPI or Assessment and Plan.  Objective  Well appearing patient in no apparent distress; mood and affect are within normal limits.  A full examination was performed including scalp, head, eyes, ears, nose, lips, neck, chest, axillae, abdomen, back, buttocks, bilateral upper extremities, bilateral lower extremities, hands, feet, fingers, toes, fingernails, and toenails. All findings within normal limits unless otherwise noted below.  Objective  L nasal ala: Well healed scar with no evidence of recurrence.   Objective  multiple see history: Scar with no evidence of recurrence.   Objective  Head - Anterior (Face) (4): Erythematous thin papules/macules with gritty scale.   Objective  face, R upper back (2): Erythematous keratotic or waxy stuck-on papule or plaque.   Objective  L nasal bridge: Yellowish papule   Objective  L infranasal: Small pink papule    Assessment & Plan  History of basal cell carcinoma (BCC) L nasal ala  Clear. Observe for recurrence. Call clinic for new or changing lesions.  Recommend regular skin exams, daily broad-spectrum spf 30+ sunscreen use, and photoprotection.     History of dysplastic nevus multiple see history  Clear. Observe for recurrence. Call clinic for new or changing lesions.  Recommend regular skin exams, daily broad-spectrum spf 30+ sunscreen use, and photoprotection.     AK  (actinic keratosis) (4) Head - Anterior (Face)  Destruction of lesion - Head - Anterior (Face) Complexity: simple   Destruction method: cryotherapy   Informed consent: discussed and consent obtained   Timeout:  patient name, date of birth, surgical site, and procedure verified Lesion destroyed using liquid nitrogen: Yes   Region frozen until ice ball extended beyond lesion: Yes   Outcome: patient tolerated procedure well with no complications   Post-procedure details: wound care instructions given    Inflamed seborrheic keratosis (2) face, R upper back  Destruction of lesion - face, R upper back Complexity: simple   Destruction method: cryotherapy   Informed consent: discussed and consent obtained   Timeout:  patient name, date of birth, surgical site, and procedure verified Lesion destroyed using liquid nitrogen: Yes   Region frozen until ice ball extended beyond lesion: Yes   Outcome: patient tolerated procedure well with no complications   Post-procedure details: wound care instructions given    Sebaceous hyperplasia of face L nasal bridge  Benign-appearing.  Observation.  Call clinic for new or changing moles.  Recommend daily use of broad spectrum spf 30+ sunscreen to sun-exposed areas.    Milia L infranasal Benign-appearing.  Observation.  Call clinic for new or changing lesions.  Recommend daily use of broad spectrum spf 30+ sunscreen to sun-exposed areas.  Observe   Lentigines - Scattered tan macules - Discussed due to sun exposure - Benign, observe - Call for any changes  Seborrheic Keratoses - Stuck-on, waxy, tan-brown papules and plaques  - Discussed  benign etiology and prognosis. - Observe - Call for any changes  Melanocytic Nevi - Tan-brown and/or pink-flesh-colored symmetric macules and papules - Benign appearing on exam today - Observation - Call clinic for new or changing moles - Recommend daily use of broad spectrum spf 30+ sunscreen to  sun-exposed areas.   Hemangiomas - Red papules - Discussed benign nature - Observe - Call for any changes  Actinic Damage - Chronic, secondary to cumulative UV/sun exposure - diffuse scaly erythematous macules with underlying dyspigmentation - Recommend daily broad spectrum sunscreen SPF 30+ to sun-exposed areas, reapply every 2 hours as needed.  - Call for new or changing lesions.  Skin cancer screening performed today.  Return in about 6 months (around 04/23/2021) for TBSE .  IMarye Round, CMA, am acting as scribe for Sarina Ser, MD .  Documentation: I have reviewed the above documentation for accuracy and completeness, and I agree with the above.  Sarina Ser, MD

## 2021-04-24 ENCOUNTER — Ambulatory Visit: Payer: 59 | Admitting: Dermatology

## 2022-04-13 IMAGING — MG DIGITAL SCREENING BILAT W/ TOMO W/ CAD
8 series · 8 of 24 positions shown · non-contrast
Comparison: Previous exam(s).

CLINICAL DATA: Screening.

EXAM:
DIGITAL SCREENING BILATERAL MAMMOGRAM WITH TOMO AND CAD

[R MLO synth-2D]
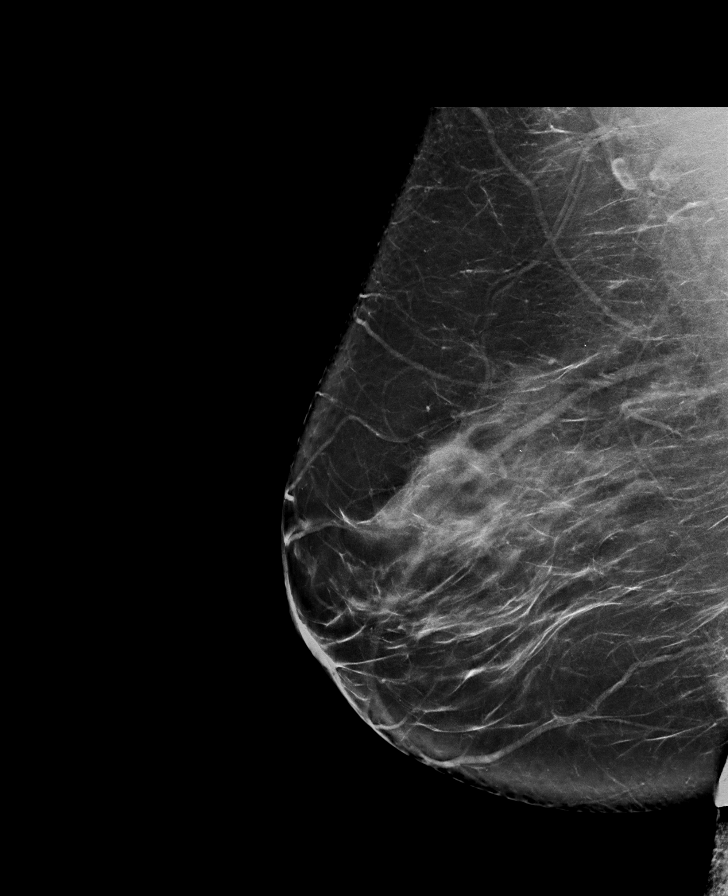

[L MLO synth-2D]
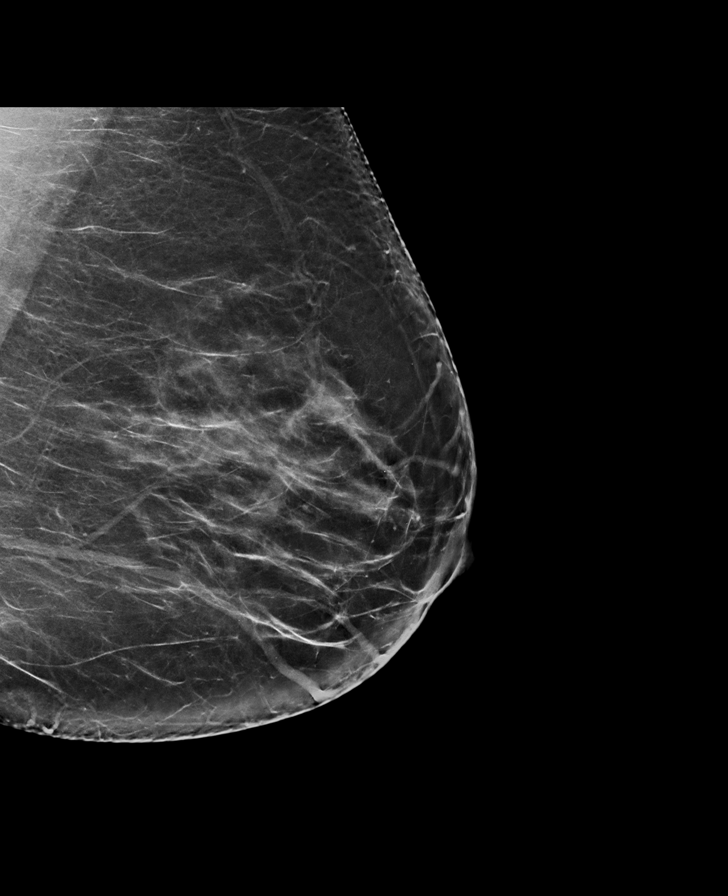

[R CC synth-2D]
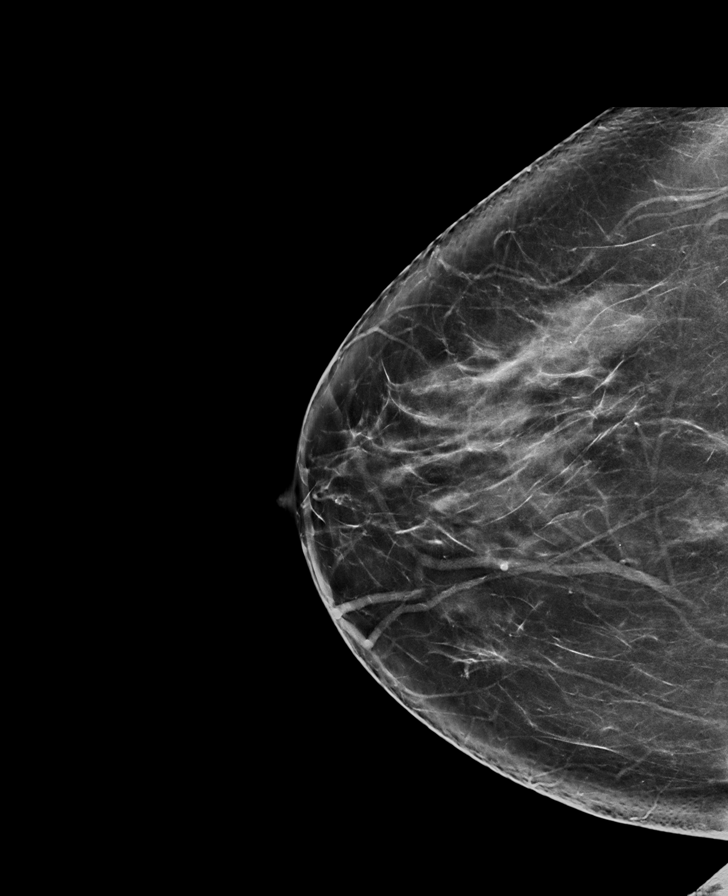

[L CC synth-2D]
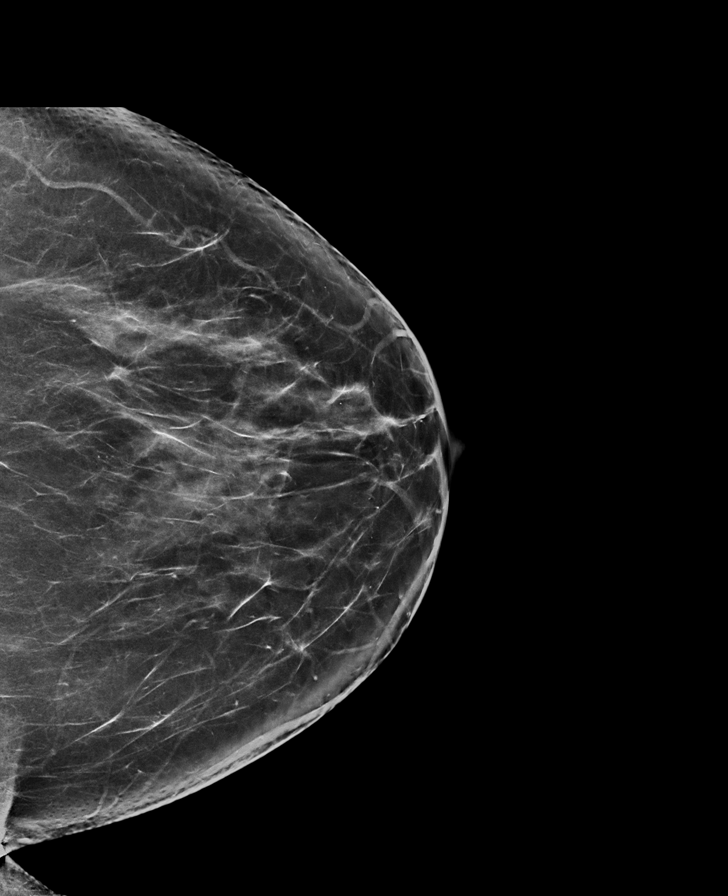

[L CC tomo · tomo slice 41/82.0]
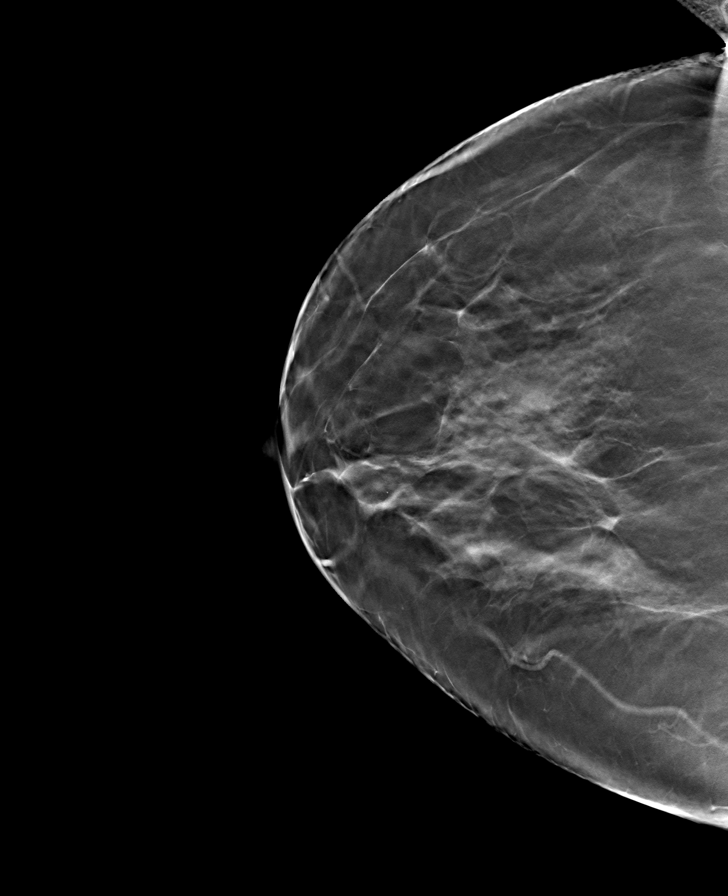

[R MLO tomo · tomo slice 47/93.0]
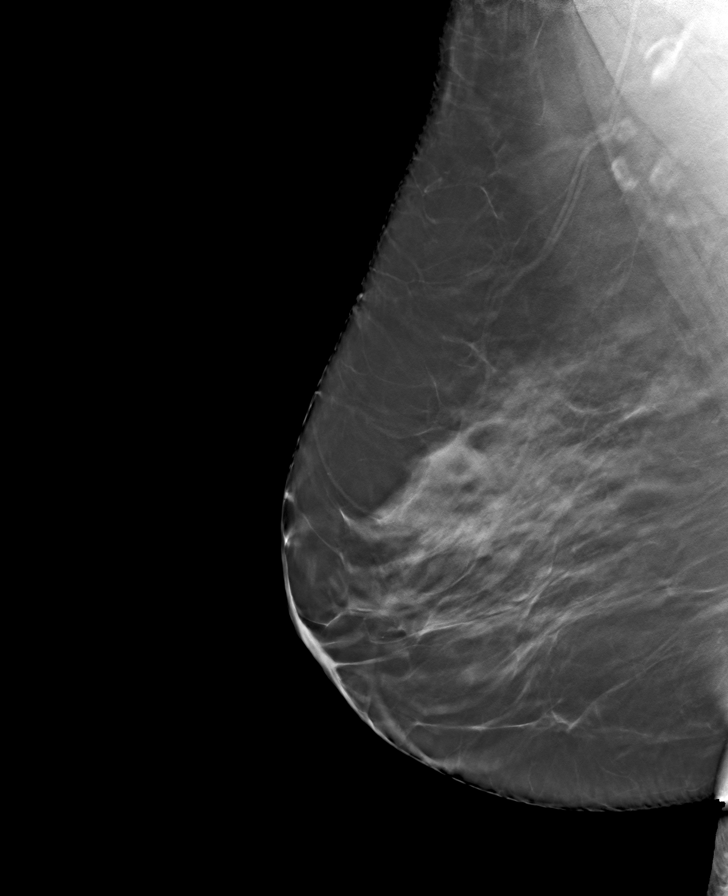

[L MLO tomo · tomo slice 45/88.0]
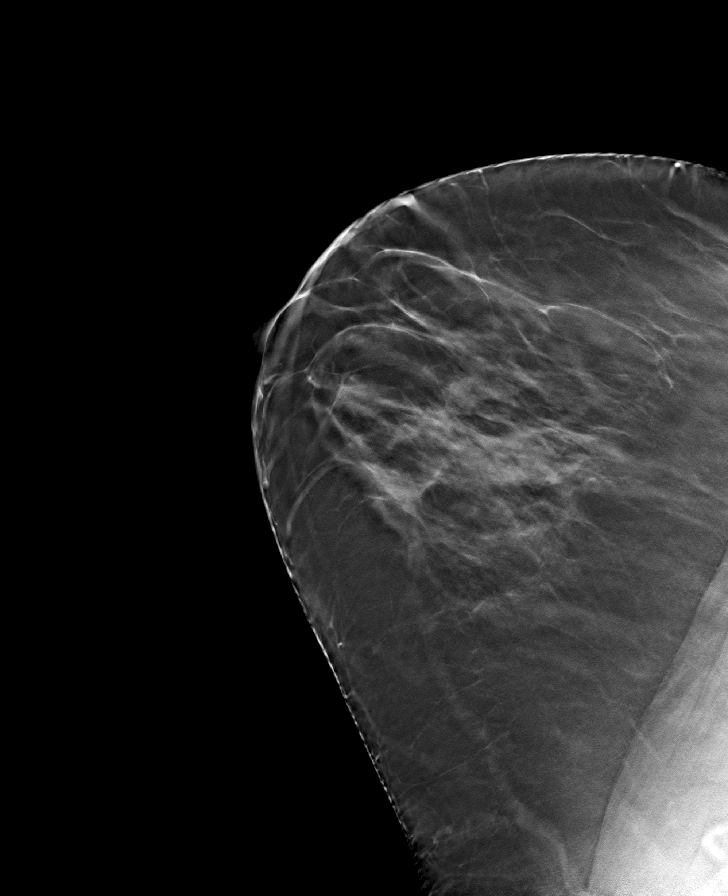

[R CC tomo · tomo slice 43/85.0]
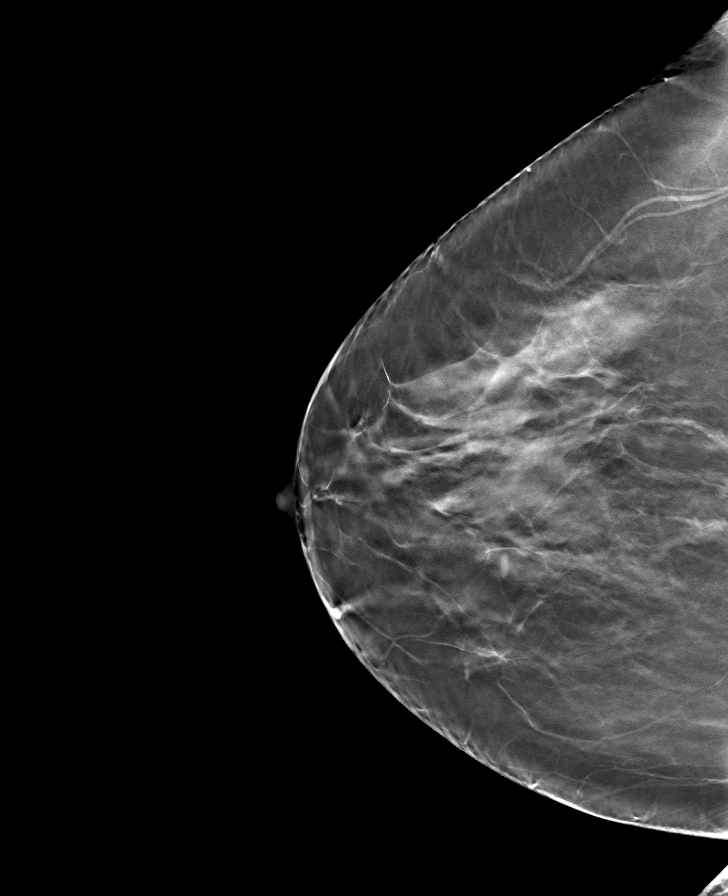

[8 of 24 positions shown; findings below may reference images not displayed]

ACR Breast Density Category c: The breast tissue is heterogeneously
dense, which may obscure small masses.
FINDINGS: In the right breast , a possible asymmetry requires further
evaluation.

In the left breast , possible distortion requires further
evaluation.

Images were processed with CAD.
IMPRESSION: Further evaluation is suggested for possible asymmetry in the right
breast.

Further evaluation is suggested for possible distortion in the left
breast.

RECOMMENDATION:
Diagnostic mammogram and possibly ultrasound of both breasts.
(Code:[id number]-O-[id number])

The patient will be contacted regarding the findings, and additional
imaging will be scheduled.

BI-RADS CATEGORY  0: Incomplete. Need additional imaging evaluation
and/or prior mammograms for comparison.
